# Patient Record
Sex: Male | Born: 1985 | Hispanic: No | Marital: Married | Smoking: Never smoker
Health system: Southern US, Community
[De-identification: ages and names within clinical notes are randomized; demographics above are authoritative.]

## PROBLEM LIST (undated history)

## (undated) DIAGNOSIS — E785 Hyperlipidemia, unspecified: Secondary | ICD-10-CM

## (undated) DIAGNOSIS — E119 Type 2 diabetes mellitus without complications: Secondary | ICD-10-CM

---

## 2021-06-26 ENCOUNTER — Encounter (HOSPITAL_COMMUNITY): Payer: Self-pay | Admitting: Emergency Medicine

## 2021-06-26 ENCOUNTER — Emergency Department (HOSPITAL_COMMUNITY)
Admission: EM | Admit: 2021-06-26 | Discharge: 2021-06-27 | Disposition: A | Discharge status: Home / Self Care | Payer: 59 | Source: Home / Self Care

## 2021-06-26 ENCOUNTER — Emergency Department (HOSPITAL_COMMUNITY): Payer: 59

## 2021-06-26 ENCOUNTER — Emergency Department (HOSPITAL_COMMUNITY)
Admission: EM | Admit: 2021-06-26 | Discharge: 2021-06-26 | Discharge status: Against Medical Advice | Payer: 59 | Attending: Emergency Medicine | Admitting: Emergency Medicine

## 2021-06-26 ENCOUNTER — Other Ambulatory Visit: Payer: Self-pay

## 2021-06-26 DIAGNOSIS — R200 Anesthesia of skin: Secondary | ICD-10-CM | POA: Insufficient documentation

## 2021-06-26 DIAGNOSIS — R791 Abnormal coagulation profile: Secondary | ICD-10-CM | POA: Insufficient documentation

## 2021-06-26 HISTORY — DX: Hyperlipidemia, unspecified: E78.5

## 2021-06-26 HISTORY — DX: Type 2 diabetes mellitus without complications: E11.9

## 2021-06-26 LAB — RAPID URINE DRUG SCREEN, HOSP PERFORMED
Amphetamines: NOT DETECTED
Barbiturates: NOT DETECTED
Benzodiazepines: NOT DETECTED
Cocaine: NOT DETECTED
Opiates: NOT DETECTED
Tetrahydrocannabinol: NOT DETECTED

## 2021-06-26 LAB — CBC WITH DIFFERENTIAL/PLATELET
Abs Immature Granulocytes: 0.05 10*3/uL (ref 0.00–0.07)
Basophils Absolute: 0.1 10*3/uL (ref 0.0–0.1)
Basophils Relative: 0 %
Eosinophils Absolute: 1.1 10*3/uL — ABNORMAL HIGH (ref 0.0–0.5)
Eosinophils Relative: 8 %
HCT: 43 % (ref 39.0–52.0)
Hemoglobin: 13.7 g/dL (ref 13.0–17.0)
Immature Granulocytes: 0 %
Lymphocytes Relative: 21 %
Lymphs Abs: 2.7 10*3/uL (ref 0.7–4.0)
MCH: 24.7 pg — ABNORMAL LOW (ref 26.0–34.0)
MCHC: 31.9 g/dL (ref 30.0–36.0)
MCV: 77.5 fL — ABNORMAL LOW (ref 80.0–100.0)
Monocytes Absolute: 0.6 10*3/uL (ref 0.1–1.0)
Monocytes Relative: 5 %
Neutro Abs: 8.3 10*3/uL — ABNORMAL HIGH (ref 1.7–7.7)
Neutrophils Relative %: 66 %
Platelets: 554 10*3/uL — ABNORMAL HIGH (ref 150–400)
RBC: 5.55 MIL/uL (ref 4.22–5.81)
RDW: 15.6 % — ABNORMAL HIGH (ref 11.5–15.5)
WBC: 12.7 10*3/uL — ABNORMAL HIGH (ref 4.0–10.5)
nRBC: 0 % (ref 0.0–0.2)

## 2021-06-26 LAB — COMPREHENSIVE METABOLIC PANEL
ALT: 38 U/L (ref 0–44)
AST: 26 U/L (ref 15–41)
Albumin: 4.1 g/dL (ref 3.5–5.0)
Alkaline Phosphatase: 75 U/L (ref 38–126)
Anion gap: 7 (ref 5–15)
BUN: 16 mg/dL (ref 6–20)
CO2: 26 mmol/L (ref 22–32)
Calcium: 9.5 mg/dL (ref 8.9–10.3)
Chloride: 101 mmol/L (ref 98–111)
Creatinine, Ser: 0.99 mg/dL (ref 0.61–1.24)
GFR, Estimated: >60 mL/min (ref >60)
Glucose, Bld: 199 mg/dL — ABNORMAL HIGH (ref 70–99)
Potassium: 3.9 mmol/L (ref 3.5–5.1)
Sodium: 134 mmol/L — ABNORMAL LOW (ref 135–145)
Total Bilirubin: 0.5 mg/dL (ref 0.3–1.2)
Total Protein: 8.6 g/dL — ABNORMAL HIGH (ref 6.5–8.1)

## 2021-06-26 LAB — I-STAT CHEM 8, ED
BUN: 14 mg/dL (ref 6–20)
Calcium, Ion: 1.24 mmol/L (ref 1.15–1.40)
Chloride: 102 mmol/L (ref 98–111)
Creatinine, Ser: 1 mg/dL (ref 0.61–1.24)
Glucose, Bld: 197 mg/dL — ABNORMAL HIGH (ref 70–99)
HCT: 43 % (ref 39.0–52.0)
Hemoglobin: 14.6 g/dL (ref 13.0–17.0)
Potassium: 3.9 mmol/L (ref 3.5–5.1)
Sodium: 140 mmol/L (ref 135–145)
TCO2: 28 mmol/L (ref 22–32)

## 2021-06-26 LAB — CBG MONITORING, ED: Glucose-Capillary: 156 mg/dL — ABNORMAL HIGH (ref 70–99)

## 2021-06-26 LAB — ETHANOL: Alcohol, Ethyl (B): <10 mg/dL (ref <10)

## 2021-06-26 LAB — URINALYSIS, ROUTINE W REFLEX MICROSCOPIC
Bilirubin Urine: NEGATIVE
Glucose, UA: NEGATIVE mg/dL
Hgb urine dipstick: NEGATIVE
Ketones, ur: NEGATIVE mg/dL
Leukocytes,Ua: NEGATIVE
Nitrite: NEGATIVE
Protein, ur: NEGATIVE mg/dL
Specific Gravity, Urine: 1.018 (ref 1.005–1.030)
pH: 6 (ref 5.0–8.0)

## 2021-06-26 LAB — APTT: aPTT: 41 s — ABNORMAL HIGH (ref 24–36)

## 2021-06-26 LAB — PROTIME-INR
INR: 1 (ref 0.8–1.2)
Prothrombin Time: 13.1 s (ref 11.4–15.2)

## 2021-06-26 NOTE — ED Triage Notes (Signed)
Reports intermittent L leg/thigh numbness and L foot numbness for the past few months. DM2, on metformin. Denies pain. A1C from 13 to 7.

## 2021-06-26 NOTE — ED Provider Triage Note (Signed)
Emergency Medicine Provider Triage Evaluation Note  Kristopher Wright , a 36 y.o. male  was evaluated in triage.  Pt complains of parasthesias and numbness and weakness in the left leg. He has chronic back pain. He has had worsening problems with ambulation due to weakness in the LLE. No recent falls. No red flag symptoms. Also endorses left arm parasthesias. Started a few days ago.   Review of Systems  Positive: Left arm tingling, left leg weakness, numbness, parasthesias, back pain Negative: Urinary/fecal incontinence, fever, chills  Physical Exam  BP (!) 168/98 (BP Location: Left Arm)    Pulse 92    Temp 98.2 F (36.8 C) (Oral)    Resp 18    Ht 6\' 3"  (1.905 m)    Wt 113.4 kg    SpO2 100%    BMI 31.25 kg/m  Gen:   Awake, no distress   Resp:  Normal effort  MSK:   Moves extremities without difficulty  Other:  Neuro with LLE 4/5 strength, LUE weakness  Medical Decision Making  Medically screening exam initiated at 11:52 AM.  Appropriate orders placed.  Waino Mounsey was informed that the remainder of the evaluation will be completed by another provider, this initial triage assessment does not replace that evaluation, and the importance of remaining in the ED until their evaluation is complete.  Rule out subacute CVA given left hemibody deficits, consider imaging of the cervical and lumbar spine.   Rush Landmark, MD 06/26/21 [phone]

## 2021-06-26 NOTE — ED Notes (Signed)
Pt respectfully declined the resp panel swab

## 2021-06-26 NOTE — ED Notes (Addendum)
MRI has informed writer pt will not receive his MRI till last person tonight since he elects no Covid test. They request he remain in the waiting room until that time. Pt is aware if he allows a Covid test he can get his MRI sooner. He elects to wait, signed out AMA and will return later this evening to be seen and receive MRI if still needed. Dr Karene Fry aware of pt leaving.

## 2021-06-27 ENCOUNTER — Other Ambulatory Visit: Payer: Self-pay

## 2021-06-27 NOTE — ED Triage Notes (Signed)
Pt Reports with intermittent left leg/thigh numbness and left foot numbness for the past 2 months.

## 2021-06-27 NOTE — ED Provider Notes (Signed)
[facility] DEPT Provider Note   CSN: [id number] Arrival date & time: 06/26/21  2223     History No chief complaint on file.   Kristopher Wright is a 36 y.o. male who presents to the emergency department for an MRI.  He was seen here earlier today and refused a COVID test for the MRI and was told that he would be last in line.  Patient signed out AMA and was told to return.  He returned at approximately 10 PM.  MRI left at approximately 11 PM.  Symptoms are unchanged from when he was here previously.  HPI     Home Medications Prior to Admission medications   Not on File      Allergies    Patient has no known allergies.    Review of Systems   Review of Systems  All other systems reviewed and are negative.  Physical Exam Updated Vital Signs BP (!) 157/98 (BP Location: Left Arm)    Pulse 88    Temp 98.2 F (36.8 C) (Oral)    Resp 17    Ht 6\' 3"  (1.905 m)    Wt 113.4 kg    SpO2 99%    BMI 31.25 kg/m  Physical Exam Vitals and nursing note reviewed.  Constitutional:      General: He is not in acute distress.    Appearance: Normal appearance.  HENT:     Head: Normocephalic and atraumatic.  Eyes:     General:        Right eye: No discharge.        Left eye: No discharge.  Cardiovascular:     Comments: Regular rate and rhythm.  S1/S2 are distinct without any evidence of murmur, rubs, or gallops.  Radial pulses are 2+ bilaterally.  Dorsalis pedis pulses are 2+ bilaterally.  No evidence of pedal edema. Pulmonary:     Comments: Clear to auscultation bilaterally.  Normal effort.  No respiratory distress.  No evidence of wheezes, rales, or rhonchi heard throughout. Abdominal:     General: Abdomen is flat. Bowel sounds are normal. There is no distension.     Tenderness: There is no abdominal tenderness. There is no guarding or rebound.  Musculoskeletal:        General: Normal range of motion.     Cervical back: Neck supple.  Skin:    General: Skin is  warm and dry.     Findings: No rash.  Neurological:     General: No focal deficit present.     Mental Status: He is alert.  Psychiatric:        Mood and Affect: Mood normal.        Behavior: Behavior normal.    ED Results / Procedures / Treatments   Labs (all labs ordered are listed, but only abnormal results are displayed) Labs Reviewed - No data to display  EKG None  Radiology CT HEAD WO CONTRAST  Result Date: 06/26/2021 CLINICAL DATA:  Left lower extremity numbness.  No known injury. EXAM: CT HEAD WITHOUT CONTRAST CT CERVICAL SPINE WITHOUT CONTRAST TECHNIQUE: Multidetector CT imaging of the head and cervical spine was performed following the standard protocol without intravenous contrast. Multiplanar CT image reconstructions of the cervical spine were also generated. RADIATION DOSE REDUCTION: This exam was performed according to the departmental dose-optimization program which includes automated exposure control, adjustment of the mA and/or kV according to patient size and/or use of iterative reconstruction technique. COMPARISON:  None. FINDINGS: CT HEAD  FINDINGS Brain: No evidence of acute infarction, hemorrhage, hydrocephalus, extra-axial collection or mass lesion/mass effect. Vascular: No hyperdense vessel or unexpected calcification. Skull: Normal. Negative for fracture or focal lesion. Sinuses/Orbits: No acute finding. Other: None. CT CERVICAL SPINE FINDINGS Alignment: Normal. Skull base and vertebrae: No acute fracture. No primary bone lesion or focal pathologic process. Soft tissues and spinal canal: No prevertebral fluid or swelling. No visible canal hematoma. Disc levels: Mild degenerative disc disease is noted at C5-6 and C6-7 with posterior osteophyte formation. Upper chest: Negative. Other: None. IMPRESSION: No acute intracranial abnormality seen. Mild multilevel degenerative changes are noted. No acute abnormality seen in the cervical spine. Electronically Signed   By: Marijo Conception M.D.   On: 06/26/2021 13:20   CT CERVICAL SPINE WO CONTRAST  Result Date: 06/26/2021 CLINICAL DATA:  Left lower extremity numbness.  No known injury. EXAM: CT HEAD WITHOUT CONTRAST CT CERVICAL SPINE WITHOUT CONTRAST TECHNIQUE: Multidetector CT imaging of the head and cervical spine was performed following the standard protocol without intravenous contrast. Multiplanar CT image reconstructions of the cervical spine were also generated. RADIATION DOSE REDUCTION: This exam was performed according to the departmental dose-optimization program which includes automated exposure control, adjustment of the mA and/or kV according to patient size and/or use of iterative reconstruction technique. COMPARISON:  None. FINDINGS: CT HEAD FINDINGS Brain: No evidence of acute infarction, hemorrhage, hydrocephalus, extra-axial collection or mass lesion/mass effect. Vascular: No hyperdense vessel or unexpected calcification. Skull: Normal. Negative for fracture or focal lesion. Sinuses/Orbits: No acute finding. Other: None. CT CERVICAL SPINE FINDINGS Alignment: Normal. Skull base and vertebrae: No acute fracture. No primary bone lesion or focal pathologic process. Soft tissues and spinal canal: No prevertebral fluid or swelling. No visible canal hematoma. Disc levels: Mild degenerative disc disease is noted at C5-6 and C6-7 with posterior osteophyte formation. Upper chest: Negative. Other: None. IMPRESSION: No acute intracranial abnormality seen. Mild multilevel degenerative changes are noted. No acute abnormality seen in the cervical spine. Electronically Signed   By: Marijo Conception M.D.   On: 06/26/2021 13:20   CT Lumbar Spine Wo Contrast  Result Date: 06/26/2021 CLINICAL DATA:  Myelopathy, acute, lumbar spine. Patient reports low right-sided back pain with left leg and foot numbness today while walking. EXAM: CT LUMBAR SPINE WITHOUT CONTRAST TECHNIQUE: Multidetector CT imaging of the lumbar spine was performed  without intravenous contrast administration. Multiplanar CT image reconstructions were also generated. RADIATION DOSE REDUCTION: This exam was performed according to the departmental dose-optimization program which includes automated exposure control, adjustment of the mA and/or kV according to patient size and/or use of iterative reconstruction technique. COMPARISON:  None. FINDINGS: Segmentation: Transitional lumbosacral anatomy. There are well-formed ribs at T12 and a transitional, partially sacralized L5 segment. Alignment: Normal. Vertebrae: No evidence of acute fracture, pars defect or focal osseous lesion. Paraspinal and other soft tissues: Unremarkable. Disc levels: The disc heights are maintained at each level. At L1-2 and L2-3, there is mild anterior osteophyte formation, but no spinal stenosis or nerve root encroachment. L3-4: Normal interspace. L4-5: Disc bulging with a small left paracentral disc protrusion. No gross L5 nerve root encroachment. Both foramina are patent. L5-S1: As numbered, this is a transitional disc space level. The left facet joint appears partially ankylosed. IMPRESSION: 1. Transitional lumbosacral anatomy. Transitional segment is assigned L5. 2. No acute osseous findings. 3. Small left paracentral disc protrusion at L4-5. No large disc herniation or high-grade spinal stenosis identified. Electronically Signed   By: Gwyndolyn Saxon  Lin Landsman M.D.   On: 06/26/2021 13:19    Procedures Procedures    Medications Ordered in ED Medications - No data to display  ED Course/ Medical Decision Making/ A&P                           Medical Decision Making  Trenton Reine is a 36 y.o. male patient who returns to the emergency department for MRI of the brain.  I discussed with him that MRI is no longer here.  I instructed him to come back tomorrow morning for an MRI.  Final Clinical Impression(s) / ED Diagnoses Final diagnoses:  Left leg numbness    Rx / DC Orders ED Discharge Orders      None         Hendricks Limes, [place] 06/27/21 0005    Daleen Bo, MD 06/27/21 1224

## 2021-09-23 ENCOUNTER — Encounter (HOSPITAL_COMMUNITY): Payer: Self-pay | Admitting: *Deleted

## 2021-09-23 ENCOUNTER — Emergency Department (HOSPITAL_COMMUNITY)
Admission: EM | Admit: 2021-09-23 | Discharge: 2021-09-24 | Disposition: A | Discharge status: Home / Self Care | Payer: 59 | Attending: Emergency Medicine | Admitting: Emergency Medicine

## 2021-09-23 ENCOUNTER — Emergency Department (HOSPITAL_COMMUNITY): Payer: 59

## 2021-09-23 ENCOUNTER — Other Ambulatory Visit: Payer: Self-pay

## 2021-09-23 DIAGNOSIS — K5900 Constipation, unspecified: Secondary | ICD-10-CM | POA: Insufficient documentation

## 2021-09-23 DIAGNOSIS — R109 Unspecified abdominal pain: Secondary | ICD-10-CM | POA: Diagnosis present

## 2021-09-23 DIAGNOSIS — E1165 Type 2 diabetes mellitus with hyperglycemia: Secondary | ICD-10-CM | POA: Insufficient documentation

## 2021-09-23 DIAGNOSIS — Z7984 Long term (current) use of oral hypoglycemic drugs: Secondary | ICD-10-CM | POA: Insufficient documentation

## 2021-09-23 DIAGNOSIS — R1033 Periumbilical pain: Secondary | ICD-10-CM | POA: Insufficient documentation

## 2021-09-23 DIAGNOSIS — E119 Type 2 diabetes mellitus without complications: Secondary | ICD-10-CM

## 2021-09-23 DIAGNOSIS — D72829 Elevated white blood cell count, unspecified: Secondary | ICD-10-CM | POA: Insufficient documentation

## 2021-09-23 LAB — COMPREHENSIVE METABOLIC PANEL
ALT: 39 U/L (ref 0–44)
AST: 25 U/L (ref 15–41)
Albumin: 3.9 g/dL (ref 3.5–5.0)
Alkaline Phosphatase: 61 U/L (ref 38–126)
Anion gap: 8 (ref 5–15)
BUN: 14 mg/dL (ref 6–20)
CO2: 25 mmol/L (ref 22–32)
Calcium: 9.4 mg/dL (ref 8.9–10.3)
Chloride: 103 mmol/L (ref 98–111)
Creatinine, Ser: 1.06 mg/dL (ref 0.61–1.24)
GFR, Estimated: >60 mL/min (ref >60)
Glucose, Bld: 130 mg/dL — ABNORMAL HIGH (ref 70–99)
Potassium: 3.9 mmol/L (ref 3.5–5.1)
Sodium: 136 mmol/L (ref 135–145)
Total Bilirubin: 0.3 mg/dL (ref 0.3–1.2)
Total Protein: 7.5 g/dL (ref 6.5–8.1)

## 2021-09-23 LAB — URINALYSIS, ROUTINE W REFLEX MICROSCOPIC
Bilirubin Urine: NEGATIVE
Glucose, UA: NEGATIVE mg/dL
Hgb urine dipstick: NEGATIVE
Ketones, ur: NEGATIVE mg/dL
Leukocytes,Ua: NEGATIVE
Nitrite: NEGATIVE
Protein, ur: NEGATIVE mg/dL
Specific Gravity, Urine: 1.02 (ref 1.005–1.030)
pH: 6 (ref 5.0–8.0)

## 2021-09-23 LAB — LIPASE, BLOOD: Lipase: 35 U/L (ref 11–51)

## 2021-09-23 LAB — CBC
HCT: 42.5 % (ref 39.0–52.0)
Hemoglobin: 13.6 g/dL (ref 13.0–17.0)
MCH: 24.8 pg — ABNORMAL LOW (ref 26.0–34.0)
MCHC: 32 g/dL (ref 30.0–36.0)
MCV: 77.4 fL — ABNORMAL LOW (ref 80.0–100.0)
Platelets: 443 10*3/uL — ABNORMAL HIGH (ref 150–400)
RBC: 5.49 MIL/uL (ref 4.22–5.81)
RDW: 15.7 % — ABNORMAL HIGH (ref 11.5–15.5)
WBC: 12.7 10*3/uL — ABNORMAL HIGH (ref 4.0–10.5)
nRBC: 0 % (ref 0.0–0.2)

## 2021-09-23 MED ORDER — IOHEXOL 300 MG/ML  SOLN
100.0000 mL | Freq: Once | INTRAMUSCULAR | Status: AC | PRN
  Administered 2021-09-23: 100 mL via INTRAVENOUS

## 2021-09-23 MED ORDER — IOHEXOL 9 MG/ML PO SOLN
ORAL | Status: AC
  Filled 2021-09-23: qty 1000

## 2021-09-23 NOTE — ED Triage Notes (Signed)
The pt is c/o abd pain for 2 years intermittently  since 1700 today the pain has returned  he also has constipation last bm was 2-3 days ago ?

## 2021-09-23 NOTE — ED Provider Notes (Signed)
[facility] EMERGENCY DEPARTMENT Provider Note   CSN: [id number] Arrival date & time: 09/23/21  1907     History  Chief Complaint  Patient presents with   Abdominal Pain    Kristopher Wright is a 36 y.o. male w/ pmh of HLD, DM type 2 who presents with abdominal pain and constipation. Patient has a hx of constipation which is intermittent.  Patient states that he has been having intermittent sharp stabbing pain to the left of his umbilicus.  Has been coming and going for the past 4 days.  He has not made a bowel movement in 4 days.  Patient states that the pain became so severe tonight he came to the emergency department.  He has not had any imaging of his abdomen in the past.  He denies nausea vomiting.   Abdominal Pain     Home Medications Prior to Admission medications   Medication Sig Start Date End Date Taking? Authorizing Provider  anastrozole (ARIMIDEX) 1 MG tablet Take 1 mg by mouth every Sunday. 08/01/21  Yes [provider]  B Complex-C (B-COMPLEX WITH VITAMIN C) tablet Take 1 tablet by mouth daily.   Yes [provider]  dicyclomine (BENTYL) 20 MG tablet Take 1 tablet (20 mg total) by mouth 2 (two) times daily as needed for spasms. 09/24/21  Yes Eyvonne Burchfield, PA-C  FEROSUL 325 (65 Fe) MG tablet Take 325 mg by mouth daily. 08/07/21  Yes [provider]  ibuprofen (ADVIL) 200 MG tablet Take 400 mg by mouth every 6 (six) hours as needed for headache or moderate pain.   Yes [provider]  JANUVIA 100 MG tablet Take 100 mg by mouth daily. 07/12/21  Yes [provider]  metFORMIN (GLUCOPHAGE) 500 MG tablet Take 500 mg by mouth 3 (three) times daily. 09/10/21  Yes [provider]  simvastatin (ZOCOR) 20 MG tablet Take 20 mg by mouth at bedtime. 08/21/21  Yes [provider]      Allergies    Patient has no known allergies.    Review of Systems   Review of Systems  Gastrointestinal:  Positive for  abdominal pain.   Physical Exam Updated Vital Signs BP 135/87 (BP Location: Left Arm)   Pulse 84   Temp 98.4 F (36.9 C) (Oral)   Resp 18   Ht 6\' 3"  (1.905 m)   Wt 113.4 kg   SpO2 100%   BMI 31.25 kg/m  Physical Exam Vitals and nursing note reviewed.  Constitutional:      General: He is not in acute distress.    Appearance: He is well-developed. He is not diaphoretic.  HENT:     Head: Normocephalic and atraumatic.  Eyes:     General: No scleral icterus.    Conjunctiva/sclera: Conjunctivae normal.  Cardiovascular:     Rate and Rhythm: Normal rate and regular rhythm.     Heart sounds: Normal heart sounds.  Pulmonary:     Effort: Pulmonary effort is normal. No respiratory distress.     Breath sounds: Normal breath sounds.  Abdominal:     Palpations: Abdomen is soft.     Tenderness: There is abdominal tenderness in the periumbilical area.     Hernia: No hernia is present.  Musculoskeletal:     Cervical back: Normal range of motion and neck supple.  Skin:    General: Skin is warm and dry.  Neurological:     Mental Status: He is alert.  Psychiatric:  Behavior: Behavior normal.    ED Results / Procedures / Treatments   Labs (all labs ordered are listed, but only abnormal results are displayed) Labs Reviewed  COMPREHENSIVE METABOLIC PANEL - Abnormal; Notable for the following components:      Result Value   Glucose, Bld 130 (*)    All other components within normal limits  CBC - Abnormal; Notable for the following components:   WBC 12.7 (*)    MCV 77.4 (*)    MCH 24.8 (*)    RDW 15.7 (*)    Platelets 443 (*)    All other components within normal limits  URINALYSIS, ROUTINE W REFLEX MICROSCOPIC - Abnormal; Notable for the following components:   Color, Urine AMBER (*)    APPearance HAZY (*)    All other components within normal limits  LIPASE, BLOOD    EKG None  Radiology No results found.  Procedures Procedures    Medications Ordered in  ED Medications  iohexol (OMNIPAQUE) 300 MG/ML solution 100 mL (100 mLs Intravenous Contrast Given 09/23/21 2342)    ED Course/ Medical Decision Making/ A&P                           Medical Decision Making Pt w/ sharp abdominal pain, resolved. Benign abd exam. The differential diagnosis for generalized abdominal pain includes, but is not limited to AAA, gastroenteritis, appendicitis, Bowel obstruction, Bowel perforation. Gastroparesis, DKA, Hernia, Inflammatory bowel disease, mesenteric ischemia, pancreatitis, peritonitis SBP, volvulus.  After review of all data points patient does not appear to have emergent intrabdominal pathology. Similarly I am not concerned with ACS.   CT imaging without cute findiings. ? Sharp gas pain.  The patient appears reasonably screened and/or stabilized for discharge and I doubt any other medical condition or other [facility] requiring further screening, evaluation, or treatment in the ED at this time prior to discharge.      Problems Addressed: Periumbilical abdominal pain: self-limited or minor problem Type 2 diabetes mellitus without complication, without long-term current use of insulin ([place]): chronic illness or injury  Amount and/or Complexity of Data Reviewed Independent Historian: spouse Labs: ordered.    Details: leukocytosis- insignificant  mild hyperglycemia Radiology: ordered and independent interpretation performed.    Details: Ct abd and pelvis without sig findings. i interpreted images  Risk Prescription drug management.           Final Clinical Impression(s) / ED Diagnoses Final diagnoses:  Periumbilical abdominal pain  Type 2 diabetes mellitus without complication, without long-term current use of insulin ([place])    Rx / DC Orders ED Discharge Orders          Ordered    dicyclomine (BENTYL) 20 MG tablet  2 times daily PRN        09/24/21 0004              Margarita Mail, PA-C 09/26/21 0746    Drenda Freeze, MD 09/29/21 1536

## 2021-09-23 NOTE — ED Notes (Signed)
Patient finished with both CT oral contrasts.  CT made aware.  ?

## 2021-09-24 MED ORDER — DICYCLOMINE HCL 20 MG PO TABS: 20.0000 mg | ORAL_TABLET | Freq: Two times a day (BID) | ORAL | 0 refills | Status: DC | PRN

## 2021-09-24 NOTE — Discharge Instructions (Signed)

## 2021-11-17 ENCOUNTER — Other Ambulatory Visit: Payer: Self-pay

## 2021-11-17 DIAGNOSIS — R0989 Other specified symptoms and signs involving the circulatory and respiratory systems: Secondary | ICD-10-CM

## 2021-11-17 DIAGNOSIS — R200 Anesthesia of skin: Secondary | ICD-10-CM

## 2021-11-24 DIAGNOSIS — M545 Low back pain, unspecified: Secondary | ICD-10-CM | POA: Insufficient documentation

## 2021-11-28 ENCOUNTER — Ambulatory Visit
Admission: RE | Admit: 2021-11-28 | Discharge: 2021-11-28 | Disposition: A | Discharge status: Home / Self Care | Payer: BC Managed Care – PPO | Source: Ambulatory Visit

## 2021-11-28 DIAGNOSIS — R0989 Other specified symptoms and signs involving the circulatory and respiratory systems: Secondary | ICD-10-CM

## 2021-11-28 DIAGNOSIS — R200 Anesthesia of skin: Secondary | ICD-10-CM

## 2021-12-13 DIAGNOSIS — M5416 Radiculopathy, lumbar region: Secondary | ICD-10-CM | POA: Insufficient documentation

## 2022-02-06 ENCOUNTER — Encounter: Payer: Self-pay | Admitting: Podiatry

## 2022-02-06 ENCOUNTER — Ambulatory Visit (INDEPENDENT_AMBULATORY_CARE_PROVIDER_SITE_OTHER): Payer: BC Managed Care – PPO | Admitting: Podiatry

## 2022-02-06 DIAGNOSIS — L600 Ingrowing nail: Secondary | ICD-10-CM | POA: Diagnosis not present

## 2022-02-06 MED ORDER — NEOMYCIN-POLYMYXIN-HC 1 % OT SOLN: OTIC | 1 refills | Status: DC

## 2022-02-06 NOTE — Patient Instructions (Signed)

## 2022-02-06 NOTE — Progress Notes (Signed)
  Subjective:  Patient ID: Kristopher Wright, male    DOB: 1986-04-28,  MRN: [id number] HPI Chief Complaint  Patient presents with   Toe Pain    Hallux bilateral - lateral border, ingrown x few years, gets pedicures regularly and they trim out   Diabetes    Last a1c was 7.2   New Patient (Initial Visit)    36 y.o. male presents with the above complaint.   ROS: Denies fever chills nausea vomiting muscle aches pains calf pain back pain chest pain shortness of breath.  Past Medical History:  Diagnosis Date   Diabetes mellitus, type 2 ([place])    Dyslipidemia    No past surgical history on file.  Current Outpatient Medications:    NEOMYCIN-POLYMYXIN-HYDROCORTISONE (CORTISPORIN) 1 % SOLN OTIC solution, Apply 1-2 drops to toe BID after soaking, Disp: 10 mL, Rfl: 1   B Complex-C (B-COMPLEX WITH VITAMIN C) tablet, Take 1 tablet by mouth daily., Disp: , Rfl:    FEROSUL 325 (65 Fe) MG tablet, Take 325 mg by mouth daily., Disp: , Rfl:    ibuprofen (ADVIL) 200 MG tablet, Take 400 mg by mouth every 6 (six) hours as needed for headache or moderate pain., Disp: , Rfl:    JANUVIA 100 MG tablet, Take 100 mg by mouth daily., Disp: , Rfl:    metFORMIN (GLUCOPHAGE) 500 MG tablet, Take 500 mg by mouth 3 (three) times daily., Disp: , Rfl:    tadalafil (CIALIS) 10 MG tablet, Take 10 mg by mouth daily as needed., Disp: , Rfl:    Vitamin D, Ergocalciferol, (DRISDOL) 1.25 MG (50000 UNIT) CAPS capsule, Take 50,000 Units by mouth once a week., Disp: , Rfl:   No Known Allergies Review of Systems Objective:  There were no vitals filed for this visit.  General: Well developed, nourished, in no acute distress, alert and oriented x3   Dermatological: Skin is warm, dry and supple bilateral. Nails x 10 are well maintained; remaining integument appears unremarkable at this time. There are no open sores, no preulcerative lesions, no rash or signs of infection present.  Sharp incurvated nail margins mild erythema mild  tenderness on palpation  Vascular: Dorsalis Pedis artery and Posterior Tibial artery pedal pulses are 2/4 bilateral with immedate capillary fill time. Pedal hair growth present. No varicosities and no lower extremity edema present bilateral.   Neruologic: Grossly intact via light touch bilateral. Vibratory intact via tuning fork bilateral. Protective threshold with Semmes Wienstein monofilament intact to all pedal sites bilateral. Patellar and Achilles deep tendon reflexes 2+ bilateral. No Babinski or clonus noted bilateral.   Musculoskeletal: No gross boney pedal deformities bilateral. No pain, crepitus, or limitation noted with foot and ankle range of motion bilateral. Muscular strength 5/5 in all groups tested bilateral.  Gait: Unassisted, Nonantalgic.    Radiographs:  None taken  Assessment & Plan:   Assessment: Ingrown toenails fibular border hallux bilateral.   Plan: Chemical matricectomy was performed today fibular border of the hallux bilateral tolerated procedure well without complications.  Was given both oral and written home-going structure for the care and soaking of the toes.  He was also prescribed Cortisporin otic to be applied twice daily after soaking.  We will follow-up with him in about 2 weeks     Marijah Larranaga T. [place], [place]

## 2022-02-20 ENCOUNTER — Ambulatory Visit (INDEPENDENT_AMBULATORY_CARE_PROVIDER_SITE_OTHER): Payer: BC Managed Care – PPO | Admitting: Podiatry

## 2022-02-20 DIAGNOSIS — Z9889 Other specified postprocedural states: Secondary | ICD-10-CM

## 2022-02-20 DIAGNOSIS — L600 Ingrowing nail: Secondary | ICD-10-CM

## 2022-02-20 MED ORDER — AMOXICILLIN 500 MG PO CAPS: 500.0000 mg | ORAL_CAPSULE | Freq: Three times a day (TID) | ORAL | 0 refills | Status: AC

## 2022-02-20 NOTE — Progress Notes (Signed)
He presents today for follow-up of his matrixectomy hallux bilateral.  He states he really does not have any pain with walking but he does have some swelling he has not seen any discharge.  Continues to soak every day twice daily Epsom salts warm water apply Cortisporin otic drops.  He relates that he is having some jaw pain and is needing to have an extraction.  Objective: Only the right hallux along the fibular border demonstrates some mild erythema.  Hallux left demonstrates no erythema no edema cellulitis drainage or odor the right does demonstrate a mild amount of tenderness on palpation and mild erythema.  Assessment: Matrixectomy hallux bilateral left is doing fine right does demonstrate a mild paronychia.  Plan: At this point I will start her on amoxicillin 500 mg 1 p.o. 3 times daily x10 days.  This should help with his tenderness in that toe as well and increase in the concentration of Epsom salts.  He will continue soaking twice daily and continue the Cortisporin otic.  Follow-up with me in 2 weeks if necessary.

## 2022-04-19 DIAGNOSIS — J3089 Other allergic rhinitis: Secondary | ICD-10-CM | POA: Diagnosis not present

## 2022-04-19 DIAGNOSIS — J019 Acute sinusitis, unspecified: Secondary | ICD-10-CM | POA: Diagnosis not present

## 2022-04-19 DIAGNOSIS — H6983 Other specified disorders of Eustachian tube, bilateral: Secondary | ICD-10-CM | POA: Diagnosis not present

## 2022-05-17 ENCOUNTER — Ambulatory Visit: Payer: Medicaid Other | Admitting: Podiatry

## 2022-05-25 DIAGNOSIS — Z79899 Other long term (current) drug therapy: Secondary | ICD-10-CM | POA: Diagnosis not present

## 2022-05-25 DIAGNOSIS — I10 Essential (primary) hypertension: Secondary | ICD-10-CM | POA: Diagnosis not present

## 2022-05-25 DIAGNOSIS — Z6832 Body mass index (BMI) 32.0-32.9, adult: Secondary | ICD-10-CM | POA: Diagnosis not present

## 2022-05-25 DIAGNOSIS — E7800 Pure hypercholesterolemia, unspecified: Secondary | ICD-10-CM | POA: Diagnosis not present

## 2022-05-25 DIAGNOSIS — H6690 Otitis media, unspecified, unspecified ear: Secondary | ICD-10-CM | POA: Diagnosis not present

## 2022-05-25 DIAGNOSIS — E559 Vitamin D deficiency, unspecified: Secondary | ICD-10-CM | POA: Diagnosis not present

## 2022-05-25 DIAGNOSIS — R5383 Other fatigue: Secondary | ICD-10-CM | POA: Diagnosis not present

## 2022-05-25 DIAGNOSIS — E119 Type 2 diabetes mellitus without complications: Secondary | ICD-10-CM | POA: Diagnosis not present

## 2022-05-30 DIAGNOSIS — E119 Type 2 diabetes mellitus without complications: Secondary | ICD-10-CM | POA: Diagnosis not present

## 2022-05-30 DIAGNOSIS — Z6832 Body mass index (BMI) 32.0-32.9, adult: Secondary | ICD-10-CM | POA: Diagnosis not present

## 2022-06-11 ENCOUNTER — Ambulatory Visit: Payer: Medicaid Other | Admitting: Podiatry

## 2022-06-11 ENCOUNTER — Encounter: Payer: Self-pay | Admitting: Podiatry

## 2022-06-11 DIAGNOSIS — L600 Ingrowing nail: Secondary | ICD-10-CM | POA: Diagnosis not present

## 2022-06-11 MED ORDER — NEOMYCIN-POLYMYXIN-HC 1 % OT SOLN: OTIC | 1 refills | Status: AC

## 2022-06-11 NOTE — Patient Instructions (Signed)

## 2022-06-11 NOTE — Progress Notes (Signed)
He presents today for follow-up of his matrixectomy fibular border hallux left states that the other day started bleeding and is bit been very uncomfortable to walk on.  Objective: Vital signs are stable he is alert and oriented x 3 appears he has a small spicule with abscess along the fibular border  Looking at the picture he showed me on his phone of blood that come up from the lateral margin.  Did not demonstrate any purulence however.  Assessment: Ingrown toenail status post matrixectomy.  Plan: Chemical matricectomy was performed again today and not sure that the matrixectomy did not take before but there was a small spicule about midway is up on the lateral side I trimmed that off and just reapplied phenol to that lateral border.  Hopefully this will prevent any further issues with this.  Flush the area good with alcohol and then dressed a compressive dressing Silvadene cream was placed he was given both oral and home-going instructions for care and soaking of the toe.

## 2022-07-04 ENCOUNTER — Encounter: Payer: Self-pay | Admitting: Podiatry

## 2022-07-09 ENCOUNTER — Ambulatory Visit: Payer: Medicaid Other | Admitting: Podiatry

## 2022-07-09 DIAGNOSIS — Z9889 Other specified postprocedural states: Secondary | ICD-10-CM

## 2022-07-09 DIAGNOSIS — L600 Ingrowing nail: Secondary | ICD-10-CM

## 2022-07-09 NOTE — Progress Notes (Signed)
  Subjective:  Patient ID: Kristopher Wright, male    DOB: 06-Feb-1986,  MRN: [id number]  Chief Complaint  Patient presents with   Toe Pain    Pain in right toe. Ingrown removed about 3 months ago. Denies any pus or bleeding. Last 7.0 A1C (a few weeks ago)    37 y.o. male presents with concern for pain in the right hallux.  He has ingrown border on the lateral aspect of the right great toe.  He has previously had this side treated before but this seems to have returned.  He has pain with pressure on the area.  He is also having ingrown treated on his left hallux which was done more recently approximately 2 to 3 weeks ago.  Past Medical History:  Diagnosis Date   Diabetes mellitus, type 2 ([place])    Dyslipidemia     No Known Allergies  ROS: Negative except as per HPI above  Objective:  General: AAO x3, NAD  Dermatological: Incurvation is present along the bilateral nail border of the right great toe. There is localized edema without any erythema or increase in warmth around the nail border. There is no drainage or pus. There is no ascending cellulitis. No malodor. No open lesions or pre-ulcerative lesions.    Vascular:  Dorsalis Pedis artery and Posterior Tibial artery pedal pulses are 2/4 bilateral.  Capillary fill time < 3 sec to all digits.   Neruologic: Grossly intact via light touch bilateral. Protective threshold intact to all sites bilateral.   Musculoskeletal: No gross boney pedal deformities bilateral. No pain, crepitus, or limitation noted with foot and ankle range of motion bilateral. Muscular strength 5/5 in all groups tested bilateral.  Gait: Unassisted, Nonantalgic.   No images are attached to the encounter.    Assessment:   1. Ingrown right big toenail   2. Ingrown left big toenail   3. Status post surgical removal of nail matrix of toe      Plan:  Patient was evaluated and treated and all questions answered.   Ingrown Nail, right-recurrent ingrown -Patient  elects to proceed with minor surgery to remove ingrown toenail today. Consent reviewed and signed by patient. -Ingrown nail excised. See procedure note. -Educated on post-procedure care including soaking. Written instructions provided and reviewed. -Patient to follow up in 2 weeks for nail check.  Procedure: Excision of Ingrown Toenail Location: Right 1st toe  bilateral  nail borders. Anesthesia: Lidocaine 1% plain; 1.5 mL and Marcaine 0.5% plain; 1.5 mL, digital block. Skin Prep: Betadine. Dressing: Silvadene; telfa; dry, sterile, compression dressing. Technique: Following skin prep, the toe was exsanguinated and a tourniquet was secured at the base of the toe. The affected nail border was freed, split with a nail splitter, and excised. Chemical matrixectomy was then performed with phenol and irrigated out with alcohol. The tourniquet was then removed and sterile dressing applied. Disposition: Patient tolerated procedure well. Patient to return in 2 weeks for follow-up.    Return in about 2 weeks (around 07/23/2022) for R right hallux recurrent ingrown medial and lateral toenail P&A.          Everitt Amber, DPM [facility] / [facility]

## 2022-07-09 NOTE — Patient Instructions (Signed)

## 2022-07-23 ENCOUNTER — Ambulatory Visit: Payer: Medicaid Other | Admitting: Podiatry

## 2022-07-23 DIAGNOSIS — L600 Ingrowing nail: Secondary | ICD-10-CM | POA: Diagnosis not present

## 2022-07-23 NOTE — Progress Notes (Signed)
Subjective: Kristopher Wright is a 37 y.o.  male returns to office today for follow up evaluation after having right Hallux bilateral border nail ingrown removal with phenol and alcohol matrixectomy approximately 2 weeks ago. Patient has been soaking using epsom salt and applying topical antibiotic covered with bandaid daily. Patient denies fevers, chills, nausea, vomiting. Denies any calf pain, chest pain, SOB.   Objective:  Vitals: Reviewed  General: Well developed, nourished, in no acute distress, alert and oriented x3   Dermatology: Skin is warm, dry and supple bilateral. right hallux nail border appears to be clean, dry, with mild granular tissue and surrounding scab. There is no surrounding erythema, edema, drainage/purulence. The remaining nails appear unremarkable at this time. There are no other lesions or other signs of infection present.  Neurovascular status: Intact. No lower extremity swelling; No pain with calf compression bilateral.  Musculoskeletal: Decreased tenderness to palpation of the right hallux nail fold(s). Muscular strength within normal limits bilateral.   Assesement and Plan: S/p phenol and alcohol matrixectomy to the  right hallux nail bilateral, doing well.   -Continue soaking in epsom salts twice a day followed by antibiotic ointment and a band-aid. Can leave uncovered at night. Continue this until completely healed.  -If the area has not healed in 2 weeks, call the office for follow-up appointment, or sooner if any problems arise.  -Monitor for any signs/symptoms of infection. Call the office immediately if any occur or go directly to the emergency room. Call with any questions/concerns.        Everitt Amber, [place] / [facility]                   07/23/2022

## 2022-08-02 ENCOUNTER — Telehealth: Payer: Self-pay

## 2022-08-02 NOTE — Telephone Encounter (Signed)
Mychat msg sent

## 2022-09-19 ENCOUNTER — Other Ambulatory Visit: Payer: Self-pay | Admitting: Physician Assistant

## 2022-09-19 DIAGNOSIS — R1310 Dysphagia, unspecified: Secondary | ICD-10-CM

## 2022-10-01 ENCOUNTER — Ambulatory Visit
Admission: RE | Admit: 2022-10-01 | Discharge: 2022-10-01 | Disposition: A | Discharge status: Home / Self Care | Payer: Medicaid Other | Source: Ambulatory Visit | Attending: Physician Assistant | Admitting: Physician Assistant

## 2022-10-01 DIAGNOSIS — R1310 Dysphagia, unspecified: Secondary | ICD-10-CM

## 2023-01-04 ENCOUNTER — Ambulatory Visit (INDEPENDENT_AMBULATORY_CARE_PROVIDER_SITE_OTHER): Payer: Medicaid Other | Admitting: Podiatry

## 2023-01-04 ENCOUNTER — Encounter: Payer: Self-pay | Admitting: Podiatry

## 2023-01-04 DIAGNOSIS — L600 Ingrowing nail: Secondary | ICD-10-CM | POA: Diagnosis not present

## 2023-01-04 NOTE — Progress Notes (Signed)
Subjective: Kristopher Wright is a 37 y.o.  male returns to office today for follow up evaluation after having right Hallux bilateral border nail ingrown removal with phenol and alcohol matrixectomy approximately 6 mo ago.  Patient states he wanted to get the area checked to make sure there was no issue of recurrence of ingrown.  He is not having any pain at this time.  Objective:  Vitals: Reviewed  General: Well developed, nourished, in no acute distress, alert and oriented x3   Dermatology: Skin is warm, dry and supple bilateral. right hallux nail border appears to be clean, dry, and well-healed. There is no surrounding erythema, edema, drainage/purulence. The remaining nails appear unremarkable at this time. There are no other lesions or other signs of infection present.  Neurovascular status: Intact. No lower extremity swelling; No pain with calf compression bilateral.  Musculoskeletal: No tenderness of the right hallux nail folds muscular strength within normal limits bilateral.   Assesement and Plan: S/p phenol and alcohol matrixectomy to the  right hallux nail bilateral, doing well.  No evidence of recurrence at this time continue to  -Monitor for any evidence of recurrence of ingrown today does happen on the lateral border of the right hallux call and we will get him back in for another procedure.         Corinna Gab, DPM [facility] / [facility]                   01/04/2023

## 2023-02-28 ENCOUNTER — Ambulatory Visit: Payer: Medicaid Other | Attending: Urology

## 2023-02-28 NOTE — Therapy (Deleted)
OUTPATIENT PHYSICAL THERAPY MALE PELVIC EVALUATION   Patient Name: Kristopher Wright MRN: [id number] DOB:Sep 19, 1985, 37 y.o., male Today's Date: 02/28/2023  END OF SESSION:   Past Medical History:  Diagnosis Date   Diabetes mellitus, type 2 (HCC)    Dyslipidemia    No past surgical history on file. 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  Rationale for Evaluation and Treatment: Rehabilitation  ONSET DATE: ***  SUBJECTIVE:                                                                                                                                                                                           SUBJECTIVE STATEMENT: *** Fluid intake: ***  PAIN:  Are you having pain? {yes/no:20286} NPRS scale: ***/10 Pain location: {pelvic pain location:27098}  Pain type: {type:313116} Pain description: {PAIN DESCRIPTION:21022940}   Aggravating factors: *** Relieving factors: ***  PRECAUTIONS: None  RED FLAGS: None   WEIGHT BEARING RESTRICTIONS: No  FALLS:  Has patient fallen in last 6 months? No  LIVING ENVIRONMENT: Lives with: {OPRC lives with:25569::"lives with their family"} Lives in: {Lives in:25570} Stairs: {opstairs:27293} Has following equipment at home: {Assistive devices:23999}  OCCUPATION: ***  PLOF: {PLOF:24004}  PATIENT GOALS: ***  PERTINENT HISTORY:  *** Sexual abuse: {Yes/No}  BOWEL MOVEMENT: Pain with bowel movement: {yes/no:20286} Type of bowel movement:{PT BM type:27100} Fully empty rectum: {Yes/No:304960894} Leakage: {Yes/No:304960894} Pads: {Yes/No:304960894} Fiber supplement:  {Yes/No:304960894}  URINATION: Pain with urination: {yes/no:20286} Fully empty bladder: {Yes/No:304960894} Stream: {PT urination:27102} Urgency: {Yes/No:304960894} Frequency: *** Leakage: {PT leakage:27103} Pads: {Yes/No:304960894}  INTERCOURSE: Pain with intercourse: {pain with intercourse PA:27099} Climax: *** Ejaculation: {Yes/No:304960894}   OBJECTIVE:  Note: Objective measures were completed at Evaluation unless otherwise noted. 02/28/23: DIAGNOSTIC FINDINGS:  ***  PATIENT SURVEYS:  {rehab surveys:24030}  PFIQ-7 ***  COGNITION: Overall cognitive status: Within functional limits for tasks assessed     SENSATION: Light touch: {intact/deficits:24005} Proprioception: {intact/deficits:24005}  MUSCLE LENGTH:   FUNCTIONAL TESTS:    GAIT: Comments: ***  POSTURE: {posture:25561}  PELVIC ALIGNMENT:  LUMBARAROM/PROM:  A/PROM A/PROM  Eval % available  Flexion   Extension   Right lateral flexion   Left lateral flexion   Right rotation   Left rotation    (Blank rows = not tested)  LOWER EXTREMITY AROM/PROM:    LOWER EXTREMITY MMT:   PALPATION: GENERAL ***  External Perineal Exam ***              Internal Pelvic Floor *** Patient confirms identification and approves PT to assess internal pelvic floor and treatment Yes  PELVIC MMT:   MMT eval  Internal Anal Sphincter   External Anal Sphincter   Puborectalis   Diastasis Recti   (Blank rows = not tested)  TONE: ***  TODAY'S TREATMENT:                                                                                                                              DATE:  02/28/23  EVAL  Manual:  Neuromuscular re-education:  Exercises:  Therapeutic activities:     PATIENT EDUCATION:  Education details: See above Person educated: Patient Education method: Programmer, multimedia, Demonstration, Tactile cues, Verbal cues, and Handouts Education comprehension: verbalized  understanding  HOME EXERCISE PROGRAM: ***  ASSESSMENT:  CLINICAL IMPRESSION: Patient is a 37 y.o. male who was seen today for physical therapy evaluation and treatment for ***.   OBJECTIVE IMPAIRMENTS: {opptimpairments:25111}.   ACTIVITY LIMITATIONS: {activitylimitations:27494}  PARTICIPATION LIMITATIONS: {participationrestrictions:25113}  PERSONAL FACTORS: {Personal factors:25162} are also affecting patient's functional outcome.   REHAB POTENTIAL: Good  CLINICAL DECISION MAKING: Stable/uncomplicated  EVALUATION COMPLEXITY: {Evaluation complexity:25115}   GOALS: Goals reviewed with patient? Yes  SHORT TERM GOALS: Target date: ***  *** Baseline: Goal status: INITIAL  2.  *** Baseline:  Goal status: INITIAL  3.  *** Baseline:  Goal status: INITIAL  4.  *** Baseline:  Goal status: INITIAL  5.  *** Baseline:  Goal status: INITIAL  6.  *** Baseline:  Goal status: INITIAL  LONG TERM GOALS: Target date: ***  *** Baseline:  Goal status: INITIAL  2.  *** Baseline:  Goal status: INITIAL  3.  *** Baseline:  Goal status: INITIAL  4.  *** Baseline:  Goal status: INITIAL  5.  *** Baseline:  Goal status: INITIAL  6.  *** Baseline:  Goal status: INITIAL   PLAN:  PT FREQUENCY: {rehab frequency:25116}  PT DURATION: {rehab duration:25117}  PLANNED INTERVENTIONS: {rehab planned interventions:25118::"97110-Therapeutic exercises","97530- Therapeutic [phone]- Neuromuscular re-education","97535- Self HKVQ","25956- Manual therapy"}  PLAN FOR NEXT SESSION: ***   Julio Alm, PT, DPT10/17/247:47 AM

## 2023-05-30 IMAGING — CT CT L SPINE W/O CM
4 of 6 series · 15 of 33 positions shown, 17 images · non-contrast
Comparison: None.

CLINICAL DATA: Myelopathy, acute, lumbar spine. Patient reports low
right-sided back pain with left leg and foot numbness today while
walking.



[Series 6: l spine soft · axial · 0.35mm/px · z∈[+872,+1000]mm · 4 of 129 slices shown]
[im 22/129  soft-tissue]
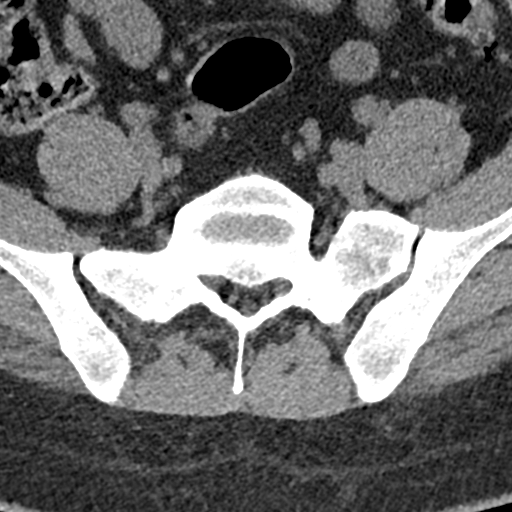
[im 43/129  soft-tissue]
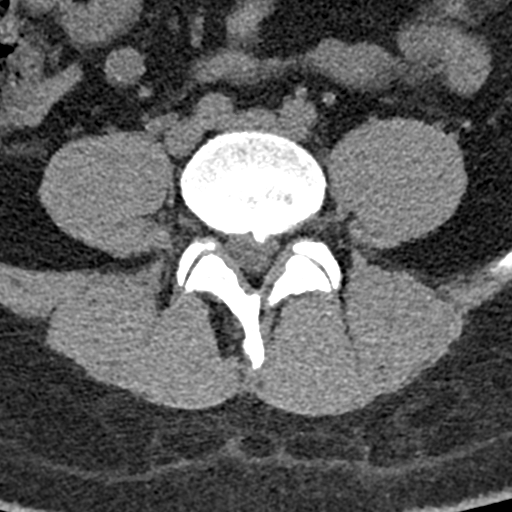
[im 65/129  soft-tissue]
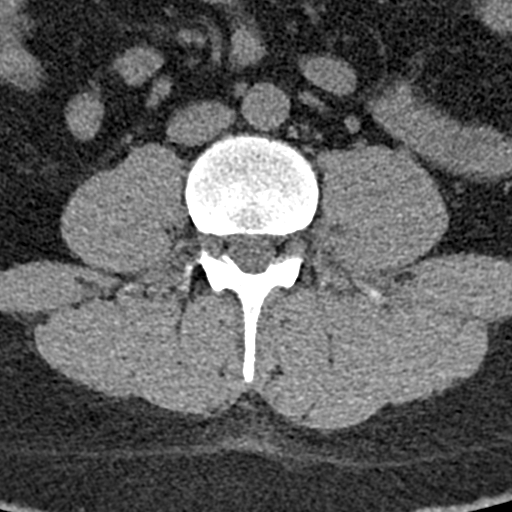
[im 86/129  soft-tissue]
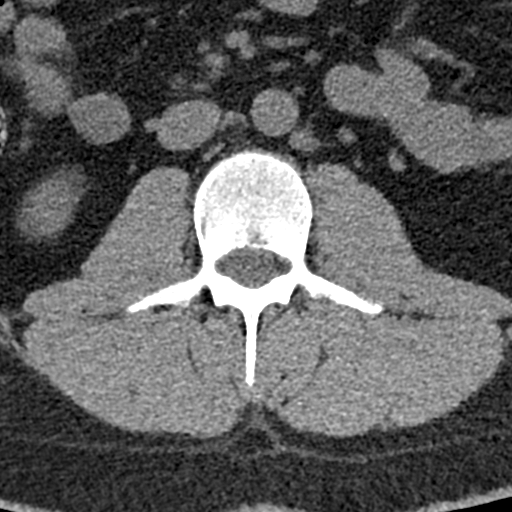

[Series 8: sagittal bone · sagittal · 0.38mm/px · 5 of 67 slices shown]
[im 12/67  bone]
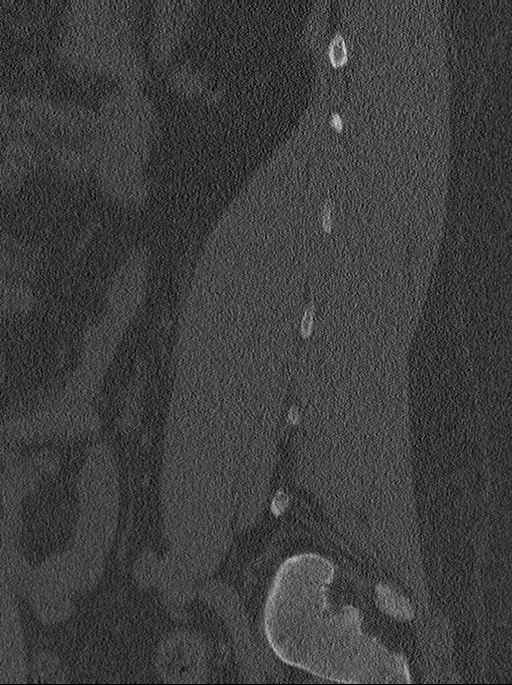
[im 23/67  bone]
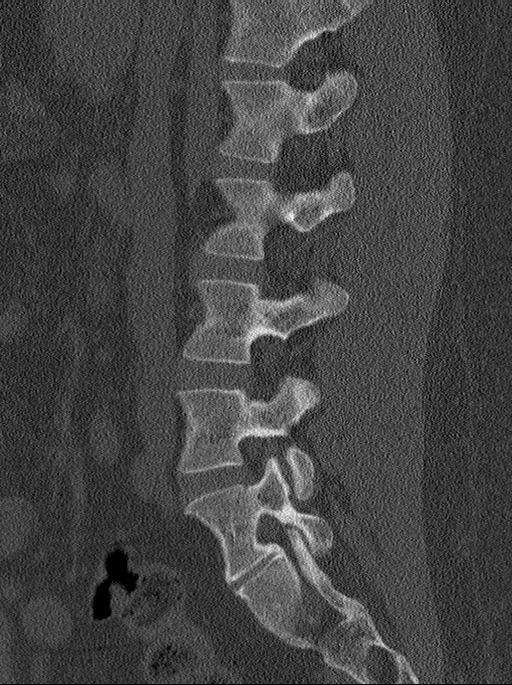
[im 34/67  bone]
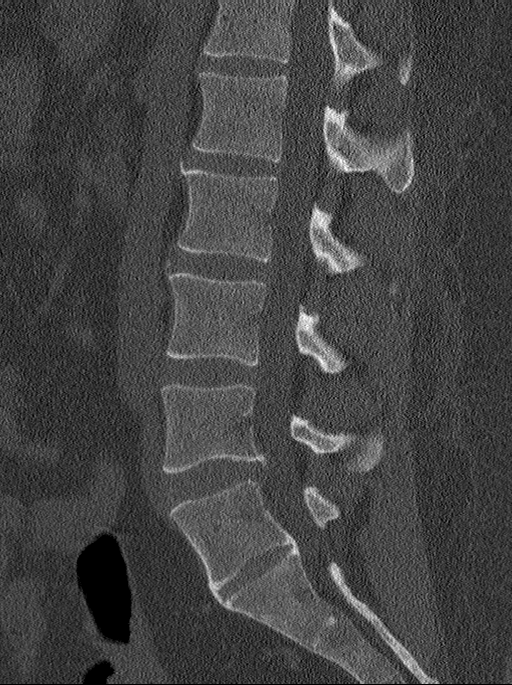
[im 45/67  bone]
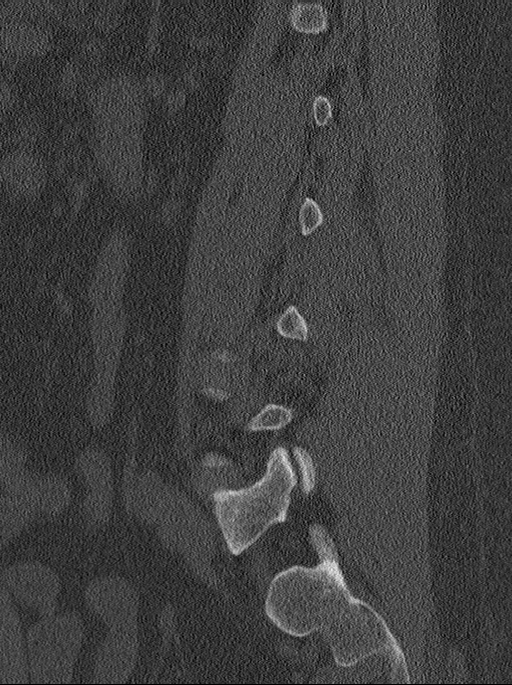
[im 56/67  bone]
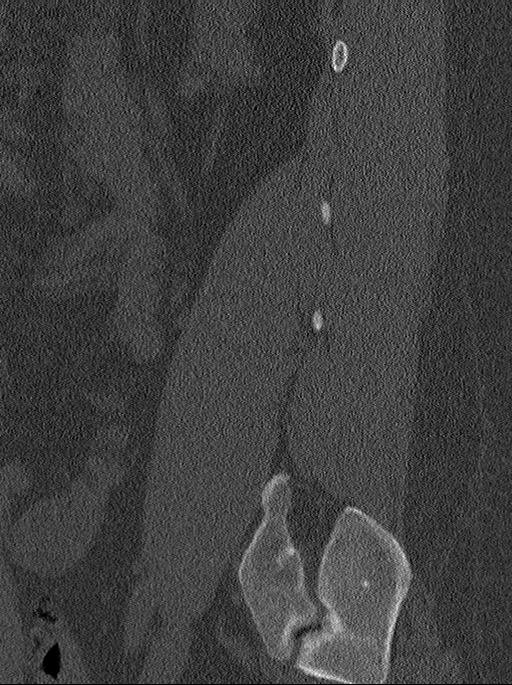

[Series 9: coronal bone · coronal · 0.38mm/px · 1 of 75 slices shown]
[im 38/75  bone]
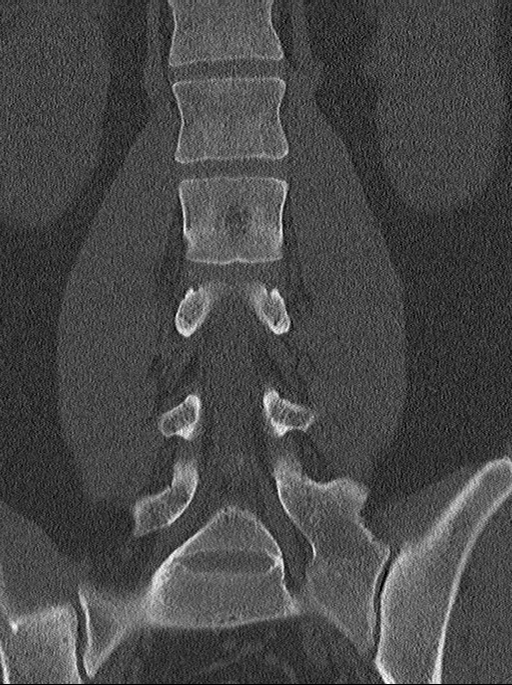

[Series 11: orthongonal axials · axial · 0.32mm/px · z∈[+872,+1042]mm · 5 of 129 slices shown, 7 images]
[im 22/129  soft-tissue]
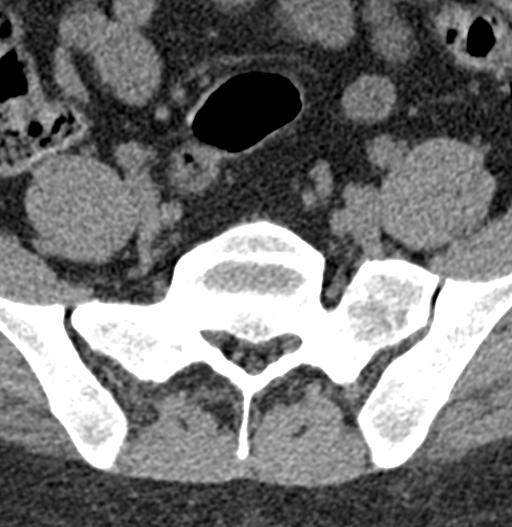
[im 22/129  bone]
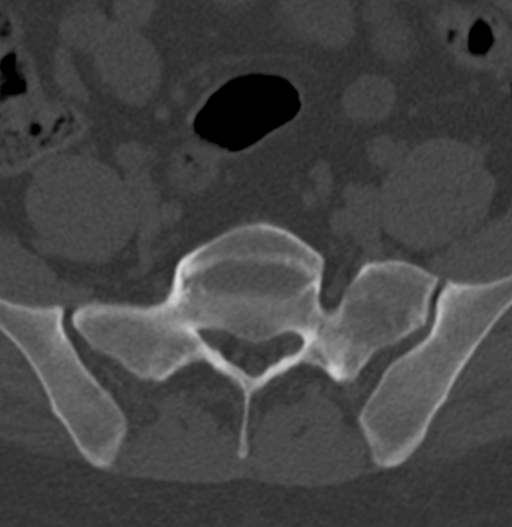
[im 43/129  bone]
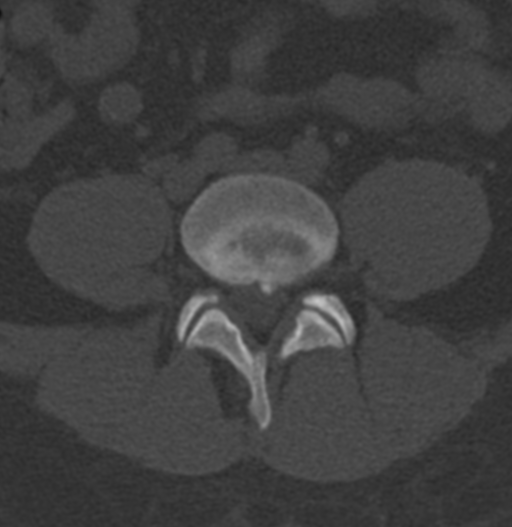
[im 65/129  bone]
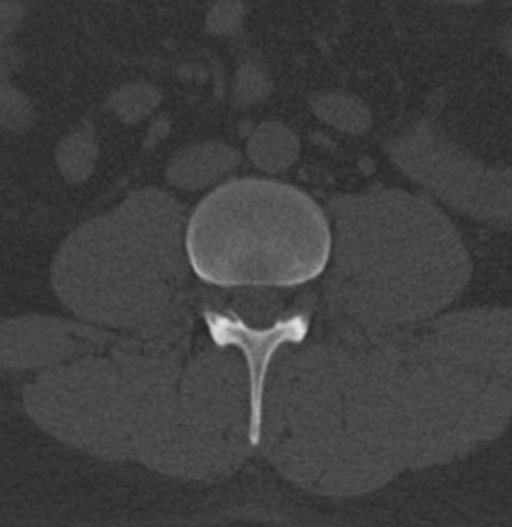
[im 86/129  bone]
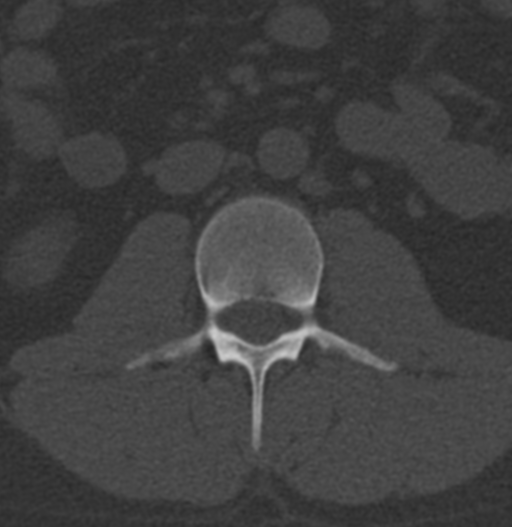
[im 107/129  soft-tissue]
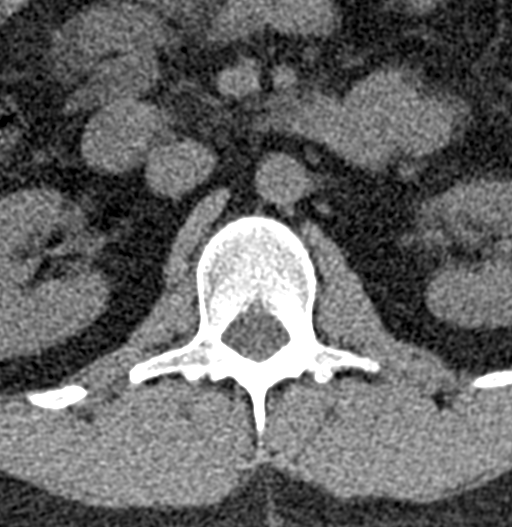
[im 107/129  bone]
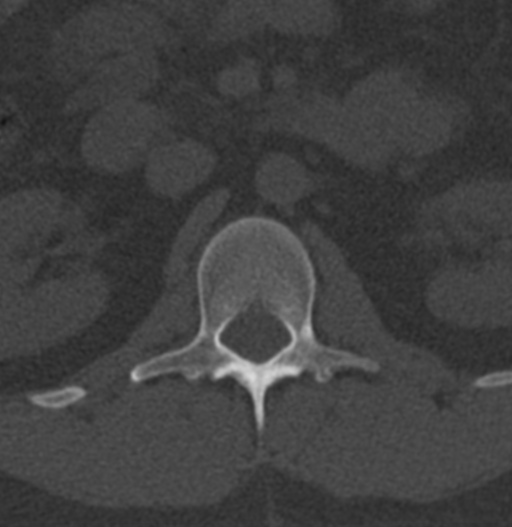

[15 of 33 positions shown; findings below may reference images not displayed]

FINDINGS: Segmentation: Transitional lumbosacral anatomy. There are
well-formed ribs at T12 and a transitional, partially sacralized L5
segment.

Alignment: Normal.

Vertebrae: No evidence of acute fracture, pars defect or focal
osseous lesion.

Paraspinal and other soft tissues: Unremarkable.

Disc levels: The disc heights are maintained at each level. At L1-2
and L2-3, there is mild anterior osteophyte formation, but no spinal
stenosis or nerve root encroachment.

L3-4: Normal interspace.

L4-5: Disc bulging with a small left paracentral disc protrusion. No
gross L5 nerve root encroachment. Both foramina are patent.

L5-S1: As numbered, this is a transitional disc space level. The
left facet joint appears partially ankylosed.
IMPRESSION: 1. Transitional lumbosacral anatomy. Transitional segment is
assigned L5.
2. No acute osseous findings.
3. Small left paracentral disc protrusion at L4-5. No large disc
herniation or high-grade spinal stenosis identified.

## 2023-07-02 ENCOUNTER — Ambulatory Visit (INDEPENDENT_AMBULATORY_CARE_PROVIDER_SITE_OTHER): Payer: Medicaid Other | Admitting: Podiatry

## 2023-07-02 ENCOUNTER — Telehealth: Payer: Self-pay | Admitting: Podiatry

## 2023-07-02 DIAGNOSIS — E1169 Type 2 diabetes mellitus with other specified complication: Secondary | ICD-10-CM

## 2023-07-02 DIAGNOSIS — L600 Ingrowing nail: Secondary | ICD-10-CM

## 2023-07-02 NOTE — Progress Notes (Unsigned)
Subjective:  Patient ID: Kristopher Wright, male    DOB: April 14, 1986,  MRN: [id number]  Kristopher Wright presents to clinic today for:  Chief Complaint  Patient presents with   Ingrown Toenail    Pt presents for    Patient states he is a type II diabetic and takes metformin.  He underwent to ingrown toenail removal x2 by Dr. Al Corpus on the right great toenail along the medial border.  He states that he keeps getting a sharp spike of nail that grows back up along that edge.  He notes that it is very painful.  He does not want to have to go through a procedure again, but is aware he may need to, and is open to it today.  He is also requesting a prescription for diabetic shoes.  No Known Allergies  Review of Systems: Negative except as noted in the HPI.  Objective:  Kristopher Wright is a pleasant 38 y.o. male in NAD. AAO x 3.  Vascular Examination: Capillary refill time is 3-5 seconds to toes bilateral. Palpable pedal pulses b/l LE. Digital hair present b/l. No pedal edema b/l. Skin temperature gradient WNL b/l. No varicosities b/l. No cyanosis or clubbing noted b/l.   Dermatological Examination: There is incurvation of the right hallux medial nail border.  There is pain on palpation of the affected nail border.  It appears that the nail is growing back and is 40% grown out and pushing into the skin.  It is severely incurvated.  There is no surrounding erythema or edema or purulence noted.  Neurological Examination: Epicritic sensation is intact  Assessment/Plan: 1. Ingrown right big toenail   2. Type 2 diabetes mellitus with other specified complication, without long-term current use of insulin (HCC)      Discussed patient's condition today.  Informed patient we will try the procedure using sodium hydroxide, as most of our specialists in this office typically use phenol.  We may have better luck that the nail will not regrow and he was amenable to this.  After obtaining patient consent, the right  hallux was anesthetized with a 50:50 mixture of 1% lidocaine plain and 0.5% bupivacaine plain for a total of 3cc's administered.  Upon confirmation of anesthesia, a freer elevator was utilized to free the right hallux medial nail border from the nail bed.  The nail border was then avulsed proximal to the eponychium and removed in toto.  The area was inspected for any remaining spicules.  A chemical matrixectomy was performed with NaOH and neutralized with acetic acid solution.  Antibiotic ointment and a DSD were applied, followed by a Coban dressing.  Patient tolerated the anesthetic and procedure well and will f/u in 2-3 weeks for recheck.  Patient given post-procedure instructions for daily 15-minute Epsom salt soaks, antibiotic ointment and daily use of Bandaids until toe starts to dry / form eschar.   Prescription was provided for diabetic shoes for [facility] prosthetics and orthotics   Return if symptoms worsen or fail to improve.   Clerance Lav, DPM, FACFAS [facility]     [address] [place] [address]                Office [phone]  Fax [phone]

## 2023-07-02 NOTE — Patient Instructions (Signed)

## 2023-07-02 NOTE — Telephone Encounter (Signed)
Pt called checking on antibiotic rx that was to be sent in. Pharmacy is correct in chart.

## 2023-07-03 NOTE — Telephone Encounter (Signed)
Notified pt that Dr Carlota Raspberry stated no infection so she was not calling in an antibiotic for pt and she had discussed at appt. I told him if anything changes to let us know.   He then said he has noticed a little bllod coming out on the bandage and I transferred to the nurse.He stated he should just keep doing the epson salt soaks as well?

## 2023-07-04 DIAGNOSIS — M489 Spondylopathy, unspecified: Secondary | ICD-10-CM | POA: Insufficient documentation

## 2023-07-16 ENCOUNTER — Encounter: Payer: Medicaid Other | Admitting: Podiatry

## 2023-07-16 NOTE — Progress Notes (Signed)
Patient did not show for scheduled appointment today.

## 2023-07-30 ENCOUNTER — Ambulatory Visit

## 2023-08-01 ENCOUNTER — Ambulatory Visit: Admitting: Podiatry

## 2023-08-01 ENCOUNTER — Encounter: Payer: Self-pay | Admitting: Podiatry

## 2023-08-01 DIAGNOSIS — L03031 Cellulitis of right toe: Secondary | ICD-10-CM | POA: Diagnosis not present

## 2023-08-01 NOTE — Patient Instructions (Signed)

## 2023-08-02 ENCOUNTER — Telehealth: Payer: Self-pay | Admitting: Podiatry

## 2023-08-02 MED ORDER — MUPIROCIN 2 % EX OINT: 1.0000 | TOPICAL_OINTMENT | Freq: Two times a day (BID) | CUTANEOUS | 2 refills | Status: AC

## 2023-08-02 NOTE — Telephone Encounter (Signed)
 Patient called and said that he was seen yesterday, and was supposed to receive a prescription ointment. He would like that medication sent to the [facility] on [address] in [place]. Thank you.

## 2023-08-04 NOTE — Progress Notes (Signed)
 Subjective:  Patient ID: Kristopher Wright, male    DOB: Jun 03, 1985,  MRN: [id number]  Chief Complaint  Patient presents with   Nail Problem    "Shortly after I had the procedure by Dr. Carlota Raspberry, I noticed I had bleeding and pus coming out.  I went to Urgent Care.  The pus continued so I went back to Urgent Care yesterday.  They put me on two antibiotics and told me to come here."    Discussed the use of AI scribe software for clinical note transcription with the patient, who gave verbal consent to proceed.   History of Present Illness Kristopher Wright is a 38 year old male with type 2 diabetes who presents with recurrent infection of the toenail.  He presents with a recurrent infection of the toenail, marking the fourth occurrence. The most recent procedure was performed by Dr. Carlota Raspberry. Initially, antibiotics were deemed unnecessary, but persistent bleeding and pus on the second day post-procedure led to a visit to urgent care, where doxycycline was prescribed for ten days.  Despite completing the antibiotic course, he continues to experience pain and pus discharge. Recently, he observed redness and was advised by an urgent care physician to seek further evaluation within 24 hours. He is currently taking ciprofloxacin 500 mg and clindamycin 300 mg as a temporary measure until further assessment.  He has a history of type 2 diabetes, with a recent improvement in A1c from 13 to 7. However, he noted a high blood sugar level of 200 on an empty stomach this morning, which he attributes to the infection. Typically, his blood sugar ranges between 120 and 130.     Objective:    Physical Exam DERMATOLOGIC: Right hallux with erythema, swelling on proximal nail fold, serous drainage. GENERAL: Normal appearance except dermatologic.    Results Procedure: Nail Plate Removal Description: The right hallux was prepped with Betadine. Anesthetized with lidocaine and Marcaine. The nail plate was removed. The area was  treated with Silvadene after flushing with alcohol. Serous drainage was noted. Post care instructions were given. Informed Consent: Counseled about the risks, benefits, and alternatives to the procedure. Discussed the necessity of removing the nail plate to clean out the infection, the potential for the nail to regrow over time, and the risks of leaving the infection untreated, including the possibility of it spreading to the bone.  LABS A1c: 7 Blood Glucose: 200 (08/01/2023)      Assessment:   1. Paronychia of toe of right foot      Plan:  Patient was evaluated and treated and all questions answered.  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 The infection likely contributes to elevated blood glucose levels in the context of type 2 diabetes. Previous procedures have been ineffective. Nail plate removal is necessary to address the infection, as it is located under the nail plate. Risks of untreated infection include potential spread to the bone, leading to severe complications. The nail is expected to regrow without long-term issues. - Perform nail plate avulsion without matricectomy under local anesthesia with lidocaine and Marcaine. - Prep the area with Betadine and flush with alcohol. - Apply Silvadene cream post-procedure. -  Complete clinda/cipro course - Schedule post-procedure follow-up in two weeks.  Type 2 Diabetes Mellitus Type 2 diabetes with recent improvement in A1c from 13 to 7. Elevated fasting blood glucose levels  are likely secondary to the current infection. Continued monitoring and management of blood glucose levels are necessary, especially in the context of infection. - Monitor blood glucose levels closely, especially in the context of infection.   Return in about 3  weeks (around 08/22/2023) for nail re-check.

## 2023-08-05 NOTE — Telephone Encounter (Signed)
 Called and let patient know that the prescription was sent in on Friday.

## 2023-08-12 ENCOUNTER — Ambulatory Visit: Admitting: Podiatry

## 2023-08-15 ENCOUNTER — Ambulatory Visit: Admitting: Podiatry

## 2023-08-15 ENCOUNTER — Encounter: Payer: Self-pay | Admitting: Podiatry

## 2023-08-15 DIAGNOSIS — M2141 Flat foot [pes planus] (acquired), right foot: Secondary | ICD-10-CM | POA: Diagnosis not present

## 2023-08-15 DIAGNOSIS — L03031 Cellulitis of right toe: Secondary | ICD-10-CM

## 2023-08-15 DIAGNOSIS — E1169 Type 2 diabetes mellitus with other specified complication: Secondary | ICD-10-CM

## 2023-08-15 DIAGNOSIS — M2142 Flat foot [pes planus] (acquired), left foot: Secondary | ICD-10-CM | POA: Diagnosis not present

## 2023-08-15 DIAGNOSIS — L84 Corns and callosities: Secondary | ICD-10-CM

## 2023-08-19 NOTE — Progress Notes (Signed)
  Subjective:  Patient ID: Kristopher Wright, male    DOB: [birth year],  MRN: [id number]  Chief Complaint  Patient presents with   Follow-up    Patient states everything has been fine since last visit    38 y.o. male presents with the above complaint. History confirmed with patient.  Doing better  Objective:  Physical Exam: warm, good capillary refill, no trophic changes or ulcerative lesions, normal DP and PT pulses, normal sensory exam, and no signs of infection healing well  Assessment:   1. Paronychia of toe of right foot   2. Pes planus of both feet   3. Type 2 diabetes mellitus with other specified complication, without long-term current use of insulin (HCC)   4. Callus of foot      Plan:  Patient was evaluated and treated and all questions answered.  Doing much better can discontinue soaks and ointment.  We discussed risk of recurrence.  He is also interested in diabetic shoes.  We discussed the risk and benefits of these.  Referral and prescription was written and he is referred to [facility] here in [place].  No follow-ups on file.

## 2023-08-27 IMAGING — CT CT ABD-PELV W/ CM
2 of 4 series · 16 of 46 positions shown, 18 images · IV contrast (APPLIED)
Comparison: 06/26/2021.

CLINICAL DATA: Abdominal pain.

EXAM:
CT ABDOMEN AND PELVIS WITH CONTRAST
TECHNIQUE: Multidetector CT imaging of the abdomen and pelvis was performed
using the standard protocol following bolus administration of
intravenous contrast.

[Series 3: abdomen 5.0 · axial · 0.98mm/px · z∈[+1056,+1481]mm · 13 of 96 slices shown, 15 images]
[im 6/96  soft-tissue]
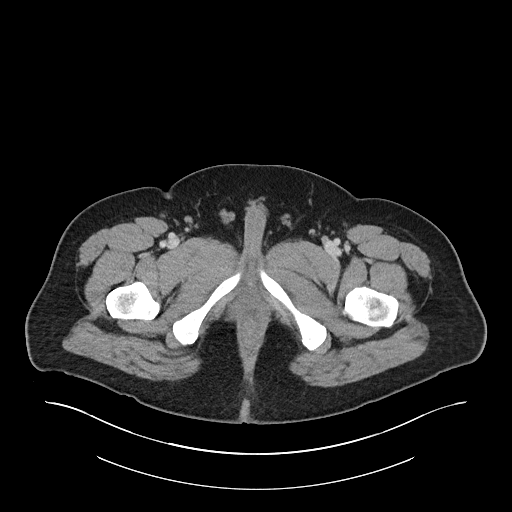
[im 6/96  bone]
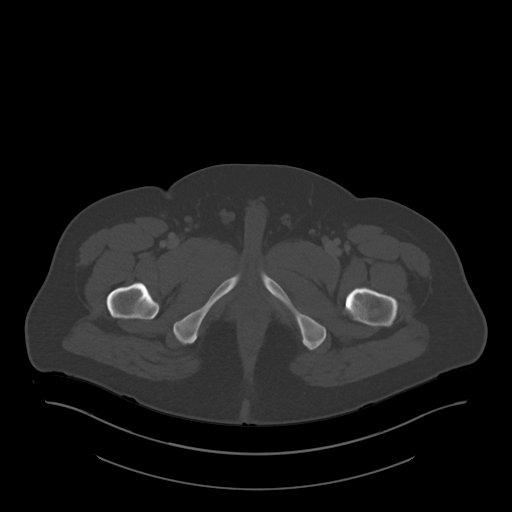
[im 16/96  soft-tissue]
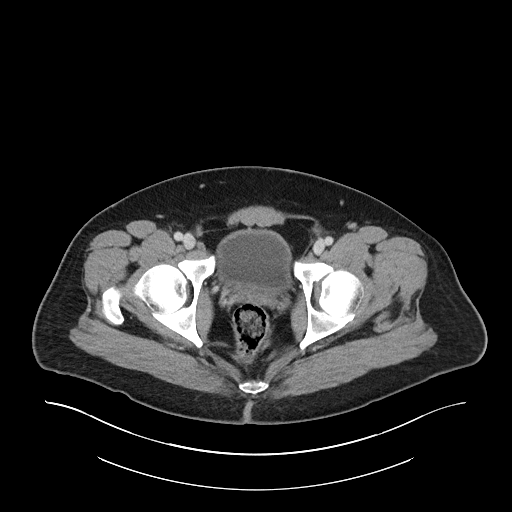
[im 21/96  soft-tissue]
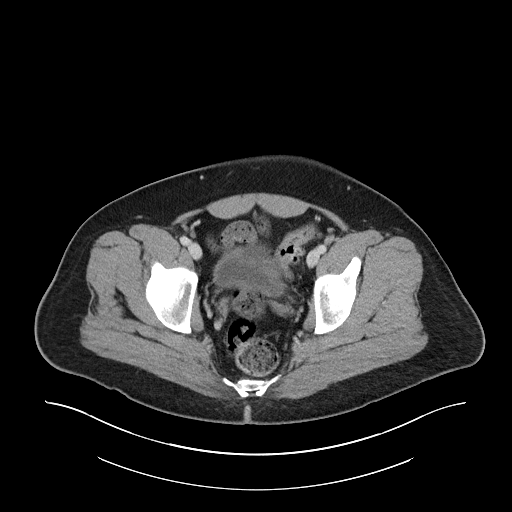
[im 26/96  soft-tissue]
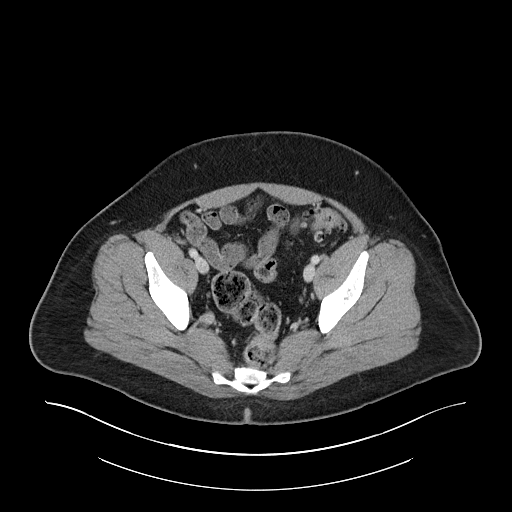
[im 36/96  soft-tissue]
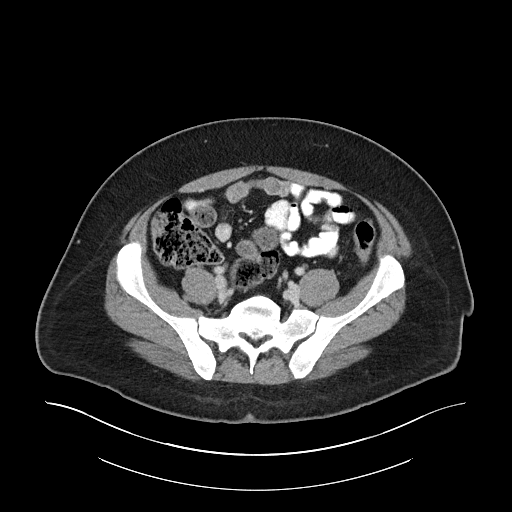
[im 41/96  soft-tissue]
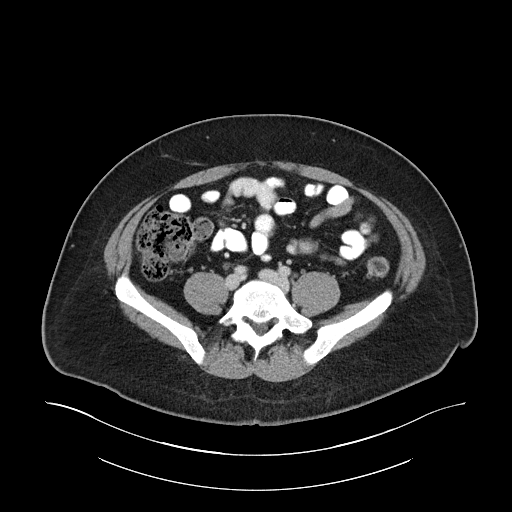
[im 51/96  soft-tissue]
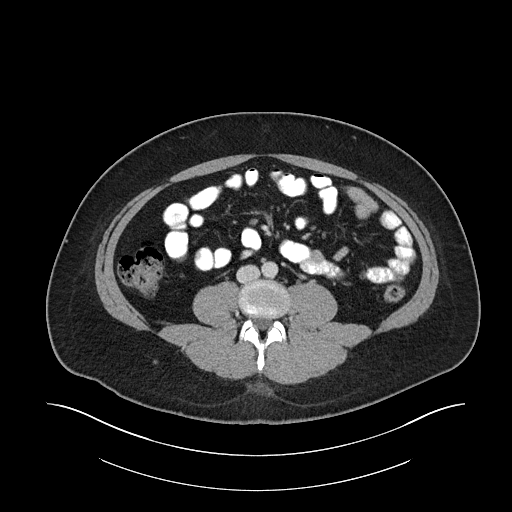
[im 56/96  soft-tissue]
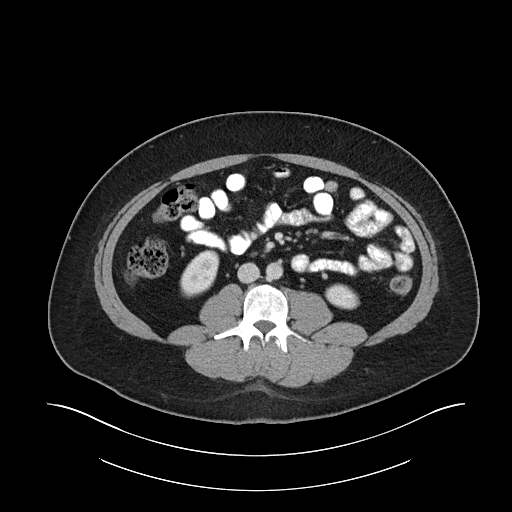
[im 61/96  soft-tissue]
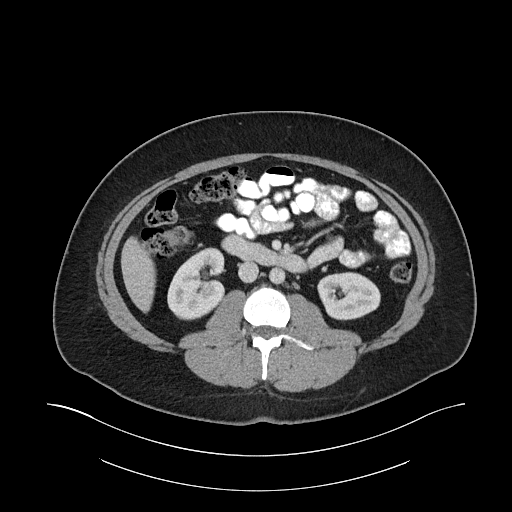
[im 61/96  bone]
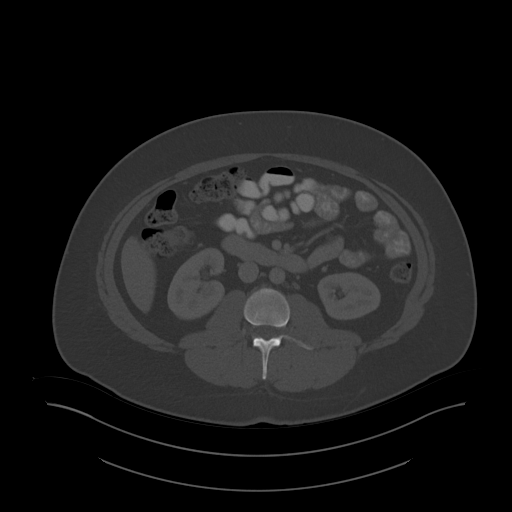
[im 71/96  soft-tissue]
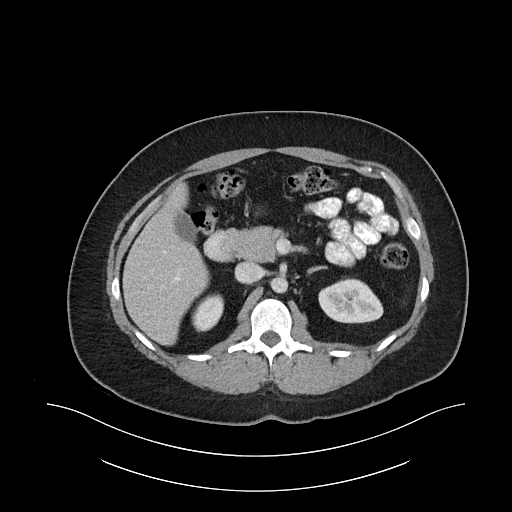
[im 76/96  soft-tissue]
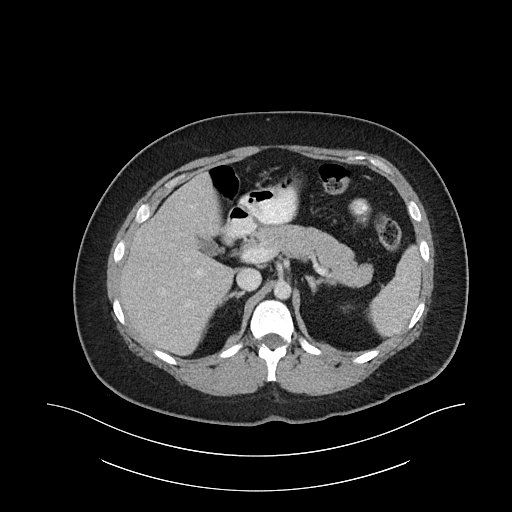
[im 81/96  soft-tissue]
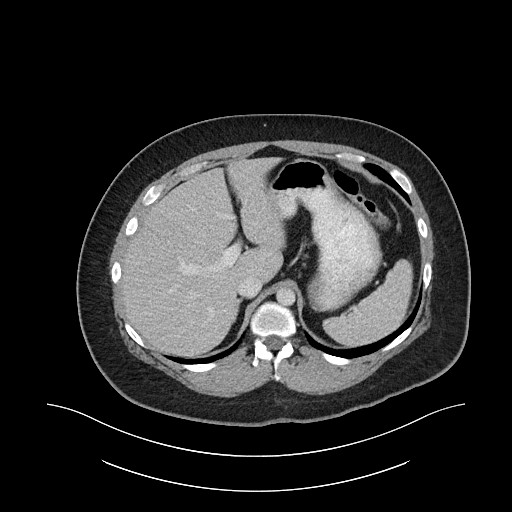
[im 91/96  soft-tissue]
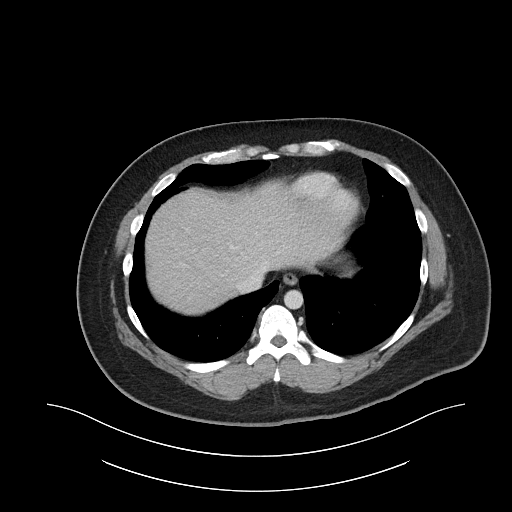

[Series 6: abdomen 3.0 mpr cor · coronal · 0.89mm/px · 3 of 104 slices shown]
[im 35/104  soft-tissue]
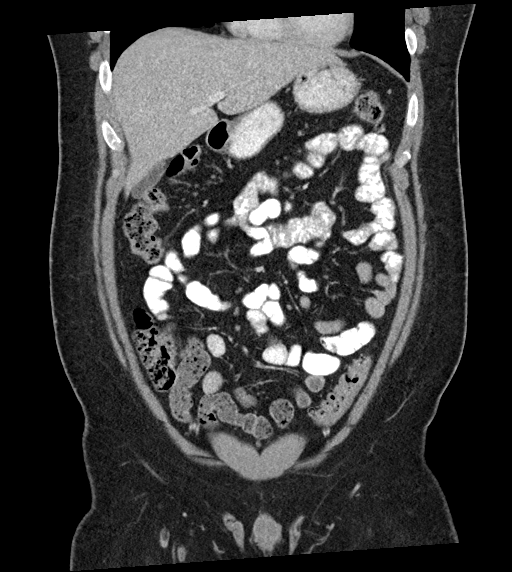
[im 46/104  soft-tissue]
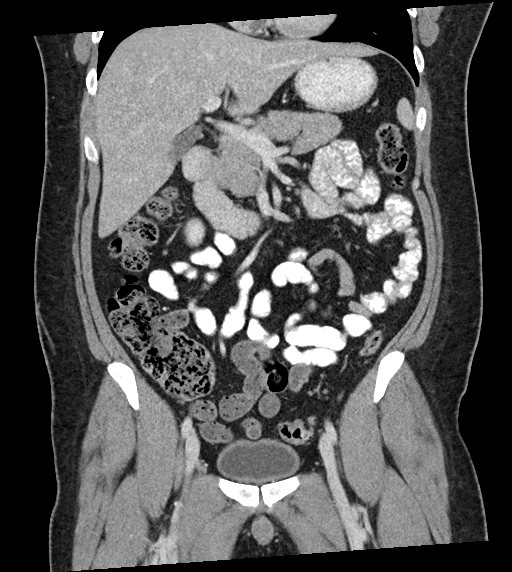
[im 58/104  soft-tissue]
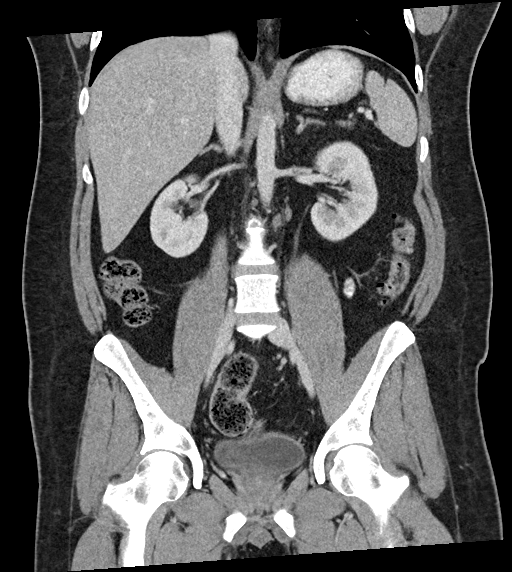

[16 of 46 positions shown; findings below may reference images not displayed]

RADIATION DOSE REDUCTION: This exam was performed according to the
departmental dose-optimization program which includes automated
exposure control, adjustment of the mA and/or kV according to
patient size and/or use of iterative reconstruction technique.

CONTRAST:  100mL OMNIPAQUE IOHEXOL 300 MG/ML  SOLN
FINDINGS: Lower chest: No acute abnormality.

Hepatobiliary: No focal liver abnormality is seen. No gallstones,
gallbladder wall thickening, or biliary dilatation.

Pancreas: Unremarkable. No pancreatic ductal dilatation or
surrounding inflammatory changes.

Spleen: Normal in size without focal abnormality.

Adrenals/Urinary Tract: No adrenal nodule or mass. The kidneys
enhance symmetrically. A subcentimeter hypodensity is noted in the
mid right kidney which is too small to further characterize. No
renal calculus or hydronephrosis. The bladder is within normal
limits.

Stomach/Bowel: Stomach is within normal limits. The appendix is not
visualized on exam, however no inflammatory changes are seen in the
right lower quadrant. No evidence of bowel wall thickening,
distention, or inflammatory changes. No free air or pneumatosis.
Scattered diverticula are present along the descending and sigmoid
colon without evidence of diverticulitis.

Vascular/Lymphatic: No significant vascular findings are present. No
enlarged abdominal or pelvic lymph nodes.

Reproductive: Prostate is unremarkable.

Other: Small fat containing umbilical hernia. No abdominopelvic
ascites.

Musculoskeletal: No acute or suspicious osseous abnormality.
IMPRESSION: 1. No acute intra-abdominal process.
2. Diverticulosis of the descending and sigmoid colon without
evidence of diverticulitis.

## 2023-09-19 ENCOUNTER — Ambulatory Visit: Admitting: Podiatry

## 2023-09-24 ENCOUNTER — Ambulatory Visit: Admitting: Podiatry

## 2023-12-18 ENCOUNTER — Ambulatory Visit (INDEPENDENT_AMBULATORY_CARE_PROVIDER_SITE_OTHER): Admitting: Podiatry

## 2023-12-18 DIAGNOSIS — Z91199 Patient's noncompliance with other medical treatment and regimen due to unspecified reason: Secondary | ICD-10-CM

## 2023-12-18 NOTE — Progress Notes (Signed)
 No show

## 2024-01-08 ENCOUNTER — Ambulatory Visit: Admitting: Podiatry

## 2024-01-08 DIAGNOSIS — L609 Nail disorder, unspecified: Secondary | ICD-10-CM | POA: Diagnosis not present

## 2024-01-08 DIAGNOSIS — L84 Corns and callosities: Secondary | ICD-10-CM

## 2024-01-08 NOTE — Progress Notes (Signed)
     Chief Complaint  Patient presents with   Nail Problem    Right hallux, would like you to look at the medial border, he is wondering if it is growing back correctly. He also has a callous bilaterally on the medial hallux.  A1c 7.1 in June No anti coag. Does take fenofibrate. (Not sure if this is considered an anti coag)   HPI: 38 y.o. male presents today after having a revision to his right hallux previous nail avulsion where a medial nail spicule kept recurring.  I have performed a revision PNA to the medial nail margin only.  He wanted to make sure there were no signs of recurrence.  He does note the nail along the other portion starts to grow upward when it grows out.  He gets calluses on both great toes.  He is requesting that they be shaved today.  States that he only wears flip-flops and does not wear shoes with arch supports or any enclosed shoes.  Past Medical History:  Diagnosis Date   Diabetes mellitus, type 2 (HCC)    Dyslipidemia    No past surgical history on file. Allergies  Allergen Reactions   Amoxicillin -Pot Clavulanate Diarrhea and Other (See Comments)    Physical Exam: Palpable pedal pulses are noted.  The right hallux medial nail border shows no signs of recurrence.  There is some slightly roughened skin in the area but no nail regrowth.  There is nail regrowth of the central and lateral portions of the nail but this is not causing pain.  The distal portion of the remaining nail does appear to be growing superiorly towards the tip of the toe in location.  There is a hyperkeratotic lesion on the plantar medial aspect of the hallux IPJ bilateral.  No surrounding erythema or soft tissue breakdown is noted.  Assessment/Plan of Care: 1. Abnormality of nail surface   2. Callus of foot     Discussed the etiology of his calluses.  They were shaved uneventfully with a sterile #313 blade.  The right hallux nail was trimmed and burred smooth to remove any irregularities.  He  seemed satisfied with this.  Patient was pleased to know that the medial border has not shown signs of recurrence on the great toenail  Zanyia Silbaugh D. Tyffani Foglesong, DPM, FACFAS [facility]     [address] [place], [place] [address]                Office [phone]  Fax [phone]

## 2024-02-03 ENCOUNTER — Ambulatory Visit: Admitting: Podiatry
